# Patient Record
Sex: Female | Born: 1960
Health system: Southern US, Community
[De-identification: ages and names within clinical notes are randomized; demographics above are authoritative.]

## PROBLEM LIST (undated history)

## (undated) DIAGNOSIS — Z8489 Family history of other specified conditions: Secondary | ICD-10-CM

## (undated) DIAGNOSIS — M1990 Unspecified osteoarthritis, unspecified site: Secondary | ICD-10-CM

## (undated) DIAGNOSIS — J189 Pneumonia, unspecified organism: Secondary | ICD-10-CM

## (undated) DIAGNOSIS — Z973 Presence of spectacles and contact lenses: Secondary | ICD-10-CM

## (undated) DIAGNOSIS — I10 Essential (primary) hypertension: Secondary | ICD-10-CM

## (undated) DIAGNOSIS — H04302 Unspecified dacryocystitis of left lacrimal passage: Secondary | ICD-10-CM

## (undated) HISTORY — PX: DILATION AND CURETTAGE OF UTERUS: SHX78

## (undated) HISTORY — DX: Essential (primary) hypertension: I10

## (undated) HISTORY — PX: BUNIONECTOMY: SHX129

## (undated) HISTORY — PX: TONSILLECTOMY: SUR1361

## (undated) HISTORY — PX: FOOT SURGERY: SHX648

## (undated) HISTORY — PX: COLONOSCOPY W/ BIOPSIES AND POLYPECTOMY: SHX1376

---

## 2001-05-11 ENCOUNTER — Encounter: Admission: RE | Admit: 2001-05-11 | Discharge: 2001-05-11 | Payer: Self-pay | Admitting: Obstetrics and Gynecology

## 2001-05-11 ENCOUNTER — Encounter: Payer: Self-pay | Admitting: Obstetrics and Gynecology

## 2001-07-20 ENCOUNTER — Other Ambulatory Visit: Admission: RE | Admit: 2001-07-20 | Discharge: 2001-07-20 | Payer: Self-pay | Admitting: Obstetrics and Gynecology

## 2002-04-25 ENCOUNTER — Encounter: Payer: Self-pay | Admitting: Obstetrics and Gynecology

## 2002-04-25 ENCOUNTER — Encounter: Admission: RE | Admit: 2002-04-25 | Discharge: 2002-04-25 | Payer: Self-pay | Admitting: Obstetrics and Gynecology

## 2005-11-02 ENCOUNTER — Encounter: Admission: RE | Admit: 2005-11-02 | Discharge: 2005-11-02 | Payer: Self-pay | Admitting: Radiology

## 2006-04-12 ENCOUNTER — Encounter: Admission: RE | Admit: 2006-04-12 | Discharge: 2006-04-12 | Payer: Self-pay | Admitting: Obstetrics and Gynecology

## 2007-06-08 ENCOUNTER — Encounter: Admission: RE | Admit: 2007-06-08 | Discharge: 2007-06-08 | Payer: Self-pay | Admitting: Obstetrics and Gynecology

## 2008-09-18 ENCOUNTER — Encounter: Admission: RE | Admit: 2008-09-18 | Discharge: 2008-09-18 | Payer: Self-pay | Admitting: Obstetrics and Gynecology

## 2009-10-31 ENCOUNTER — Encounter: Admission: RE | Admit: 2009-10-31 | Discharge: 2009-10-31 | Payer: Self-pay | Admitting: Obstetrics and Gynecology

## 2011-01-02 ENCOUNTER — Ambulatory Visit
Admission: RE | Admit: 2011-01-02 | Discharge: 2011-01-02 | Disposition: A | Discharge status: Home / Self Care | Payer: PRIVATE HEALTH INSURANCE | Source: Ambulatory Visit | Attending: Family Medicine | Admitting: Family Medicine

## 2011-01-02 ENCOUNTER — Other Ambulatory Visit: Payer: Self-pay | Admitting: Family Medicine

## 2011-01-02 DIAGNOSIS — J40 Bronchitis, not specified as acute or chronic: Secondary | ICD-10-CM

## 2011-01-12 ENCOUNTER — Other Ambulatory Visit: Payer: Self-pay | Admitting: Family Medicine

## 2011-01-12 ENCOUNTER — Ambulatory Visit
Admission: RE | Admit: 2011-01-12 | Discharge: 2011-01-12 | Disposition: A | Discharge status: Home / Self Care | Payer: PRIVATE HEALTH INSURANCE | Source: Ambulatory Visit | Attending: Family Medicine | Admitting: Family Medicine

## 2011-01-12 DIAGNOSIS — J40 Bronchitis, not specified as acute or chronic: Secondary | ICD-10-CM

## 2011-02-11 ENCOUNTER — Encounter: Payer: Self-pay | Admitting: Internal Medicine

## 2011-02-12 ENCOUNTER — Institutional Professional Consult (permissible substitution): Payer: PRIVATE HEALTH INSURANCE | Admitting: Internal Medicine

## 2012-05-18 ENCOUNTER — Other Ambulatory Visit: Payer: Self-pay | Admitting: Obstetrics and Gynecology

## 2012-05-18 DIAGNOSIS — Z1231 Encounter for screening mammogram for malignant neoplasm of breast: Secondary | ICD-10-CM

## 2012-06-07 ENCOUNTER — Ambulatory Visit
Admission: RE | Admit: 2012-06-07 | Discharge: 2012-06-07 | Disposition: A | Discharge status: Home / Self Care | Payer: PRIVATE HEALTH INSURANCE | Source: Ambulatory Visit | Attending: Obstetrics and Gynecology | Admitting: Obstetrics and Gynecology

## 2012-06-07 DIAGNOSIS — Z1231 Encounter for screening mammogram for malignant neoplasm of breast: Secondary | ICD-10-CM

## 2013-08-30 ENCOUNTER — Other Ambulatory Visit: Payer: Self-pay

## 2013-08-30 DIAGNOSIS — Z1231 Encounter for screening mammogram for malignant neoplasm of breast: Secondary | ICD-10-CM

## 2013-09-07 ENCOUNTER — Ambulatory Visit
Admission: RE | Admit: 2013-09-07 | Discharge: 2013-09-07 | Disposition: A | Discharge status: Home / Self Care | Payer: PRIVATE HEALTH INSURANCE | Source: Ambulatory Visit

## 2013-09-07 DIAGNOSIS — Z1231 Encounter for screening mammogram for malignant neoplasm of breast: Secondary | ICD-10-CM

## 2014-08-28 ENCOUNTER — Other Ambulatory Visit: Payer: Self-pay

## 2014-08-28 DIAGNOSIS — Z1231 Encounter for screening mammogram for malignant neoplasm of breast: Secondary | ICD-10-CM

## 2014-09-05 ENCOUNTER — Ambulatory Visit
Admission: RE | Admit: 2014-09-05 | Discharge: 2014-09-05 | Disposition: A | Discharge status: Home / Self Care | Payer: PRIVATE HEALTH INSURANCE | Source: Ambulatory Visit

## 2014-09-05 DIAGNOSIS — Z1231 Encounter for screening mammogram for malignant neoplasm of breast: Secondary | ICD-10-CM

## 2014-09-07 ENCOUNTER — Other Ambulatory Visit: Payer: Self-pay | Admitting: Obstetrics and Gynecology

## 2014-09-07 DIAGNOSIS — R928 Other abnormal and inconclusive findings on diagnostic imaging of breast: Secondary | ICD-10-CM

## 2014-09-24 ENCOUNTER — Ambulatory Visit
Admission: RE | Admit: 2014-09-24 | Discharge: 2014-09-24 | Disposition: A | Discharge status: Home / Self Care | Payer: PRIVATE HEALTH INSURANCE | Source: Ambulatory Visit | Attending: Obstetrics and Gynecology | Admitting: Obstetrics and Gynecology

## 2014-09-24 DIAGNOSIS — R928 Other abnormal and inconclusive findings on diagnostic imaging of breast: Secondary | ICD-10-CM

## 2015-09-10 ENCOUNTER — Other Ambulatory Visit: Payer: Self-pay

## 2015-09-10 DIAGNOSIS — Z1231 Encounter for screening mammogram for malignant neoplasm of breast: Secondary | ICD-10-CM

## 2015-10-14 ENCOUNTER — Ambulatory Visit
Admission: RE | Admit: 2015-10-14 | Discharge: 2015-10-14 | Disposition: A | Discharge status: Home / Self Care | Payer: PRIVATE HEALTH INSURANCE | Source: Ambulatory Visit

## 2015-10-14 DIAGNOSIS — Z1231 Encounter for screening mammogram for malignant neoplasm of breast: Secondary | ICD-10-CM

## 2016-12-10 ENCOUNTER — Other Ambulatory Visit: Payer: Self-pay | Admitting: Obstetrics and Gynecology

## 2016-12-10 DIAGNOSIS — Z1231 Encounter for screening mammogram for malignant neoplasm of breast: Secondary | ICD-10-CM

## 2017-01-04 ENCOUNTER — Ambulatory Visit
Admission: RE | Admit: 2017-01-04 | Discharge: 2017-01-04 | Disposition: A | Discharge status: Home / Self Care | Payer: PRIVATE HEALTH INSURANCE | Source: Ambulatory Visit | Attending: Obstetrics and Gynecology | Admitting: Obstetrics and Gynecology

## 2017-01-04 DIAGNOSIS — Z1231 Encounter for screening mammogram for malignant neoplasm of breast: Secondary | ICD-10-CM

## 2018-01-24 ENCOUNTER — Other Ambulatory Visit: Payer: Self-pay | Admitting: Obstetrics and Gynecology

## 2018-01-24 DIAGNOSIS — Z1231 Encounter for screening mammogram for malignant neoplasm of breast: Secondary | ICD-10-CM

## 2018-02-10 ENCOUNTER — Ambulatory Visit
Admission: RE | Admit: 2018-02-10 | Discharge: 2018-02-10 | Disposition: A | Discharge status: Home / Self Care | Payer: PRIVATE HEALTH INSURANCE | Source: Ambulatory Visit | Attending: Obstetrics and Gynecology | Admitting: Obstetrics and Gynecology

## 2018-02-10 DIAGNOSIS — Z1231 Encounter for screening mammogram for malignant neoplasm of breast: Secondary | ICD-10-CM

## 2018-03-10 NOTE — H&P (Signed)
Subjective:    Kristi SealsJill A Silva is a 57 y.o. female who presents for evaluation of left sided dacryocystitis. The pain is described as none. Onset was several months ago. Symptoms have been unchanged since.   Review of Systems Pertinent items are noted in HPI.    Objective:   There were no vitals taken for this visit.  General:  alert, cooperative and appears stated age Skin:  normal Eyes: conjunctivae/corneas clear. PERRL, EOM's intact. Fundi benign. left sided epiphora, nasolacrimal duct obstruction Mouth: MMM no lesions Lymph Nodes:  Cervical, supraclavicular, and axillary nodes normal. Lungs:  clear to auscultation bilaterally Heart:  regular rate and rhythm, S1, S2 normal, no murmur, click, rub or gallop Abdomen: soft, non-tender; bowel sounds normal; no masses,  no organomegaly CVA:  absent Genitourinary: defer exam Extremities:  extremities normal, atraumatic, no cyanosis or edema Neurologic:  negative Psychiatric:  normal mood, behavior, speech, dress, and thought processes    Assessment: Nasolacrimal Duct Obstruction/Dacyrocystitis LEFT Side   Plan: Dacryocystorhinostomy, Anterior Ethmoidectomy, Punctoplasty, Probe, Dilation with Stent Placement LEFT Side  1. Discussed the risk of surgery,  and the risks of general anesthetic including MI, CVA, sudden death or even reaction to anesthetic medications. The patient understands the risks, any and all questions were answered to the patient's satisfaction. 2. Follow up: 1 week.  Date of Surgery Update (To be completed by Attending Surgeon day of surgery.)

## 2018-03-30 ENCOUNTER — Encounter (HOSPITAL_COMMUNITY): Payer: Self-pay | Admitting: *Deleted

## 2018-03-30 ENCOUNTER — Other Ambulatory Visit: Payer: Self-pay

## 2018-03-30 NOTE — Progress Notes (Signed)
Pt denies SOB, chest pain, and being under the care of a cardiologist. Pt denies having a stress test, echo and cardiac cath. Pt denies having an EKG and chest x ray. Pt denies recent labs. Pt made aware to stop taking vitamins, fish oil, Flaxseed oil and herbal medications. Pt made aware to stop taking  NSAIDs ie: Ibuprofen, Advil, Naproxen (Aleve), Motrin, BC and Goody Powder. Pt verbalized understanding of all pre-op instructions.

## 2018-03-31 ENCOUNTER — Ambulatory Visit (HOSPITAL_COMMUNITY)
Admission: RE | Admit: 2018-03-31 | Discharge: 2018-03-31 | Disposition: A | Discharge status: Home / Self Care | Payer: PRIVATE HEALTH INSURANCE | Source: Ambulatory Visit | Attending: Oculoplastics Ophthalmology | Admitting: Oculoplastics Ophthalmology

## 2018-03-31 ENCOUNTER — Ambulatory Visit (HOSPITAL_COMMUNITY): Payer: PRIVATE HEALTH INSURANCE | Admitting: Anesthesiology

## 2018-03-31 ENCOUNTER — Encounter (HOSPITAL_COMMUNITY)
Admission: RE | Disposition: A | Discharge status: Home / Self Care | Payer: Self-pay | Source: Ambulatory Visit | Attending: Oculoplastics Ophthalmology

## 2018-03-31 ENCOUNTER — Encounter (HOSPITAL_COMMUNITY): Payer: Self-pay | Admitting: Certified Registered Nurse Anesthetist

## 2018-03-31 DIAGNOSIS — Z79899 Other long term (current) drug therapy: Secondary | ICD-10-CM | POA: Insufficient documentation

## 2018-03-31 DIAGNOSIS — H04552 Acquired stenosis of left nasolacrimal duct: Secondary | ICD-10-CM | POA: Insufficient documentation

## 2018-03-31 DIAGNOSIS — I10 Essential (primary) hypertension: Secondary | ICD-10-CM | POA: Diagnosis not present

## 2018-03-31 DIAGNOSIS — H04413 Chronic dacryocystitis of bilateral lacrimal passages: Secondary | ICD-10-CM | POA: Diagnosis not present

## 2018-03-31 HISTORY — PX: ETHMOIDECTOMY: SHX5197

## 2018-03-31 HISTORY — DX: Presence of spectacles and contact lenses: Z97.3

## 2018-03-31 HISTORY — DX: Pneumonia, unspecified organism: J18.9

## 2018-03-31 HISTORY — PX: LACRIMAL DUCT EXPLORATION: SHX6569

## 2018-03-31 HISTORY — DX: Unspecified dacryocystitis of left lacrimal passage: H04.302

## 2018-03-31 HISTORY — PX: DACRORHINOCYSTOTOMY: SHX5559

## 2018-03-31 HISTORY — DX: Unspecified osteoarthritis, unspecified site: M19.90

## 2018-03-31 HISTORY — DX: Family history of other specified conditions: Z84.89

## 2018-03-31 LAB — CBC
HEMATOCRIT: 43.2 % (ref 36.0–46.0)
HEMOGLOBIN: 14.8 g/dL (ref 12.0–15.0)
MCH: 30.6 pg (ref 26.0–34.0)
MCHC: 34.3 g/dL (ref 30.0–36.0)
MCV: 89.3 fL (ref 78.0–100.0)
Platelets: 278 10*3/uL (ref 150–400)
RBC: 4.84 MIL/uL (ref 3.87–5.11)
RDW: 13.3 % (ref 11.5–15.5)
WBC: 6.3 10*3/uL (ref 4.0–10.5)

## 2018-03-31 LAB — BASIC METABOLIC PANEL
Anion gap: 10 (ref 5–15)
BUN: 14 mg/dL (ref 6–20)
CALCIUM: 8.9 mg/dL (ref 8.9–10.3)
CO2: 24 mmol/L (ref 22–32)
Chloride: 105 mmol/L (ref 101–111)
Creatinine, Ser: 0.66 mg/dL (ref 0.44–1.00)
GFR calc non Af Amer: >60 mL/min (ref >60)
GLUCOSE: 110 mg/dL — AB (ref 65–99)
POTASSIUM: 3.5 mmol/L (ref 3.5–5.1)
Sodium: 139 mmol/L (ref 135–145)

## 2018-03-31 LAB — PROTIME-INR
INR: 0.96
Prothrombin Time: 12.6 seconds (ref 11.4–15.2)

## 2018-03-31 SURGERY — EXPLORATION, LACRIMAL DUCT
Anesthesia: General | Site: Eye | Laterality: Left

## 2018-03-31 MED ORDER — TETRACAINE HCL 0.5 % OP SOLN
OPHTHALMIC | Status: AC
  Filled 2018-03-31: qty 4

## 2018-03-31 MED ORDER — HYDROMORPHONE HCL 2 MG/ML IJ SOLN: 0.2500 mg | INTRAMUSCULAR | Status: DC | PRN

## 2018-03-31 MED ORDER — MIDAZOLAM HCL 2 MG/2ML IJ SOLN
INTRAMUSCULAR | Status: AC
  Filled 2018-03-31: qty 2

## 2018-03-31 MED ORDER — PROMETHAZINE HCL 6.25 MG/5ML PO SYRP: 25.0000 mg | ORAL_SOLUTION | Freq: Every evening | ORAL | 1 refills | Status: AC | PRN

## 2018-03-31 MED ORDER — PHENYLEPHRINE HCL 10 MG/ML IJ SOLN
INTRAVENOUS | Status: DC | PRN
  Administered 2018-03-31: 25 ug/min via INTRAVENOUS

## 2018-03-31 MED ORDER — OXYMETAZOLINE HCL 0.05 % NA SOLN
NASAL | Status: DC | PRN
Start: 2018-03-31 — End: 2018-03-31
  Administered 2018-03-31: 1

## 2018-03-31 MED ORDER — EPINEPHRINE HCL (NASAL) 0.1 % NA SOLN
NASAL | Status: AC
  Filled 2018-03-31: qty 30

## 2018-03-31 MED ORDER — CEFAZOLIN SODIUM 1 G IJ SOLR
INTRAMUSCULAR | Status: AC
  Filled 2018-03-31: qty 20

## 2018-03-31 MED ORDER — PREDNISONE 10 MG PO TABS: 20.0000 mg | ORAL_TABLET | Freq: Every day | ORAL | 0 refills | Status: AC

## 2018-03-31 MED ORDER — ONDANSETRON HCL 4 MG/2ML IJ SOLN
INTRAMUSCULAR | Status: DC | PRN
  Administered 2018-03-31: 4 mg via INTRAVENOUS

## 2018-03-31 MED ORDER — PROPOFOL 10 MG/ML IV BOLUS
INTRAVENOUS | Status: DC | PRN
  Administered 2018-03-31: 200 mg via INTRAVENOUS

## 2018-03-31 MED ORDER — BSS IO SOLN
INTRAOCULAR | Status: AC
  Filled 2018-03-31: qty 15

## 2018-03-31 MED ORDER — EPINEPHRINE HCL (NASAL) 0.1 % NA SOLN
NASAL | Status: DC | PRN
  Administered 2018-03-31: 30 mL via TOPICAL

## 2018-03-31 MED ORDER — BUPIVACAINE HCL (PF) 0.5 % IJ SOLN
INTRAMUSCULAR | Status: AC
  Filled 2018-03-31: qty 30

## 2018-03-31 MED ORDER — HEMOSTATIC AGENTS (NO CHARGE) OPTIME
TOPICAL | Status: DC | PRN
  Administered 2018-03-31: 1 via TOPICAL

## 2018-03-31 MED ORDER — CEFAZOLIN SODIUM-DEXTROSE 2-3 GM-%(50ML) IV SOLR
INTRAVENOUS | Status: DC | PRN
  Administered 2018-03-31: 2 g via INTRAVENOUS

## 2018-03-31 MED ORDER — STERILE WATER FOR IRRIGATION IR SOLN
Status: DC | PRN
  Administered 2018-03-31: 1000 mL

## 2018-03-31 MED ORDER — FENTANYL CITRATE (PF) 100 MCG/2ML IJ SOLN
INTRAMUSCULAR | Status: DC | PRN
  Administered 2018-03-31: 100 ug via INTRAVENOUS

## 2018-03-31 MED ORDER — 0.9 % SODIUM CHLORIDE (POUR BTL) OPTIME
TOPICAL | Status: DC | PRN
  Administered 2018-03-31: 1000 mL

## 2018-03-31 MED ORDER — LIDOCAINE HCL (PF) 1 % IJ SOLN
INTRAMUSCULAR | Status: AC
  Filled 2018-03-31: qty 30

## 2018-03-31 MED ORDER — TOBRAMYCIN-DEXAMETHASONE 0.3-0.1 % OP OINT
TOPICAL_OINTMENT | OPHTHALMIC | Status: AC
  Filled 2018-03-31: qty 3.5

## 2018-03-31 MED ORDER — LIDOCAINE HCL 1 % IJ SOLN
INTRAMUSCULAR | Status: DC | PRN
  Administered 2018-03-31: 2 mL via INTRAMUSCULAR

## 2018-03-31 MED ORDER — LACTATED RINGERS IV SOLN
INTRAVENOUS | Status: DC | PRN
  Administered 2018-03-31 (×2): via INTRAVENOUS

## 2018-03-31 MED ORDER — DEXAMETHASONE SODIUM PHOSPHATE 10 MG/ML IJ SOLN
INTRAMUSCULAR | Status: DC | PRN
  Administered 2018-03-31: 10 mg via INTRAVENOUS

## 2018-03-31 MED ORDER — PHENYLEPHRINE HCL 10 MG/ML IJ SOLN
INTRAMUSCULAR | Status: DC | PRN
  Administered 2018-03-31 (×5): 80 ug via INTRAVENOUS

## 2018-03-31 MED ORDER — FENTANYL CITRATE (PF) 250 MCG/5ML IJ SOLN
INTRAMUSCULAR | Status: AC
  Filled 2018-03-31: qty 5

## 2018-03-31 MED ORDER — PROPOFOL 10 MG/ML IV BOLUS
INTRAVENOUS | Status: AC
  Filled 2018-03-31: qty 20

## 2018-03-31 MED ORDER — LIDOCAINE HCL (CARDIAC) PF 100 MG/5ML IV SOSY
PREFILLED_SYRINGE | INTRAVENOUS | Status: DC | PRN
  Administered 2018-03-31: 40 mg via INTRAVENOUS

## 2018-03-31 MED ORDER — PROMETHAZINE HCL 25 MG/ML IJ SOLN: 6.2500 mg | INTRAMUSCULAR | Status: DC | PRN

## 2018-03-31 MED ORDER — HYDROCODONE-ACETAMINOPHEN 5-325 MG PO TABS: 1.0000 | ORAL_TABLET | Freq: Four times a day (QID) | ORAL | 0 refills | Status: AC | PRN

## 2018-03-31 MED ORDER — MIDAZOLAM HCL 5 MG/5ML IJ SOLN
INTRAMUSCULAR | Status: DC | PRN
  Administered 2018-03-31: 2 mg via INTRAVENOUS

## 2018-03-31 MED ORDER — OXYMETAZOLINE HCL 0.05 % NA SOLN
NASAL | Status: AC
  Filled 2018-03-31: qty 15

## 2018-03-31 MED ORDER — TOBRAMYCIN-DEXAMETHASONE 0.3-0.1 % OP OINT
TOPICAL_OINTMENT | OPHTHALMIC | Status: DC | PRN
  Administered 2018-03-31: 1 via OPHTHALMIC

## 2018-03-31 MED ORDER — BSS IO SOLN
INTRAOCULAR | Status: DC | PRN
  Administered 2018-03-31: 15 mL

## 2018-03-31 SURGICAL SUPPLY — 42 items
APL SRG 3 HI ABS STRL LF PLS (MISCELLANEOUS) ×1
APPLICATOR DR MATTHEWS STRL (MISCELLANEOUS) ×3 IMPLANT
BLADE EYE CATARACT 19 1.4 BEAV (BLADE) ×3 IMPLANT
BLADE SURG 15 STRL LF DISP TIS (BLADE) ×1 IMPLANT
BLADE SURG 15 STRL SS (BLADE) ×3
CLEANER TIP ELECTROSURG 2X2 (MISCELLANEOUS) ×3 IMPLANT
CLOSURE STERI-STRIP 1/2X4 (GAUZE/BANDAGES/DRESSINGS) ×1
CLOSURE WOUND 1/2 X4 (GAUZE/BANDAGES/DRESSINGS) ×1
CLSR STERI-STRIP ANTIMIC 1/2X4 (GAUZE/BANDAGES/DRESSINGS) ×2 IMPLANT
COAGULATOR SUCT 6 FR SWTCH (ELECTROSURGICAL) ×1
COAGULATOR SUCT SWTCH 10FR 6 (ELECTROSURGICAL) ×2 IMPLANT
CORD BI POLAR (MISCELLANEOUS) ×3 IMPLANT
CORD BIPOLAR FORCEPS 12FT (ELECTRODE) ×3 IMPLANT
COVER SURGICAL LIGHT HANDLE (MISCELLANEOUS) ×3 IMPLANT
DRAPE ORTHO SPLIT 87X125 STRL (DRAPES) ×3 IMPLANT
FLOSEAL 5ML (HEMOSTASIS) ×2 IMPLANT
FORCEPS BIPOLAR SPETZLER 8 1.0 (NEUROSURGERY SUPPLIES) ×2 IMPLANT
GLOVE BIO SURGEON STRL SZ7.5 (GLOVE) ×6 IMPLANT
GLOVE SURG SS PI 6.0 STRL IVOR (GLOVE) ×2 IMPLANT
GLOVE SURG SS PI 6.5 STRL IVOR (GLOVE) ×2 IMPLANT
GOWN STRL REUS W/ TWL LRG LVL3 (GOWN DISPOSABLE) ×2 IMPLANT
GOWN STRL REUS W/TWL LRG LVL3 (GOWN DISPOSABLE) ×6
NDL HYPO 30X.5 LL (NEEDLE) IMPLANT
NEEDLE HYPO 30X.5 LL (NEEDLE) ×3 IMPLANT
NS IRRIG 1000ML POUR BTL (IV SOLUTION) ×3 IMPLANT
PACK CATARACT CUSTOM (CUSTOM PROCEDURE TRAY) ×3 IMPLANT
PAD ARMBOARD 7.5X6 YLW CONV (MISCELLANEOUS) ×6 IMPLANT
PATTIES SURGICAL 1/4 X 3 (GAUZE/BANDAGES/DRESSINGS) ×3 IMPLANT
PENCIL BUTTON HOLSTER BLD 10FT (ELECTRODE) ×3 IMPLANT
SET INTBT LACRIMAL .016X.025 (MISCELLANEOUS) ×2 IMPLANT
STRIP CLOSURE SKIN 1/2X4 (GAUZE/BANDAGES/DRESSINGS) ×2 IMPLANT
SUT ETHILON 6 0 9-3 1X18 BLK (SUTURE) ×1 IMPLANT
SUT ETHILON 6 0 P 1 (SUTURE) ×3 IMPLANT
SUT ETHILON 7 0 P 1 (SUTURE) IMPLANT
SUT PLAIN 5 0 P 3 18 (SUTURE) IMPLANT
SUT SILK 4 0 P 3 (SUTURE) ×5 IMPLANT
SUT SILK 6 0 BV 1XDISCX (SUTURE) IMPLANT
SUT VIC AB 5-0 RB1 27 (SUTURE) ×2 IMPLANT
TOWEL GREEN STERILE FF (TOWEL DISPOSABLE) ×2 IMPLANT
TUBE CONNECTING 12'X1/4 (SUCTIONS) ×1
TUBE CONNECTING 12X1/4 (SUCTIONS) ×2 IMPLANT
WATER STERILE IRR 1000ML POUR (IV SOLUTION) ×3 IMPLANT

## 2018-03-31 NOTE — Discharge Instructions (Addendum)
Please keep patch on for 24hrs. Do not get patch wet. Patch is OK to remove tomorrow morning.  °You will have a medicated steri-strip covering your sutures. Please keep this intact until your post-operative appointment. It is OK to shower with this strip on- avoid rubbing the area. °You will have a suture inside your nose.- Do not pick at it.  °Start Maxitrol Ophthalmic Drops 2 times a day for 2 weeks in the operated eye.  °

## 2018-03-31 NOTE — Anesthesia Procedure Notes (Signed)
Procedure Name: Intubation Date/Time: 03/31/2018 7:41 AM Performed by: Cedric Mcclaine T, CRNA Pre-anesthesia Checklist: Patient identified, Emergency Drugs available, Suction available and Patient being monitored Patient Re-evaluated:Patient Re-evaluated prior to induction Oxygen Delivery Method: Circle system utilized Preoxygenation: Pre-oxygenation with 100% oxygen Induction Type: IV induction Ventilation: Mask ventilation without difficulty Laryngoscope Size: Miller and 2 Grade View: Grade I Tube type: Oral Tube size: 7.0 mm Number of attempts: 1 Airway Equipment and Method: Patient positioned with wedge pillow and Stylet Placement Confirmation: ETT inserted through vocal cords under direct vision,  positive ETCO2 and breath sounds checked- equal and bilateral Secured at: 21 cm Tube secured with: Tape Dental Injury: Teeth and Oropharynx as per pre-operative assessment

## 2018-03-31 NOTE — Anesthesia Postprocedure Evaluation (Signed)
Anesthesia Post Note  Patient: Kristi Silva  Procedure(s) Performed: PROBE LASOLACRIMAL DUCT WITH STENT, LACRIMAL SAC BIOPSY (Left Eye) ANTERIOR ETHMOIDECTOMY (Left Eye) DACROCYSTORHINOSTOMY (DCR) (Left Eye)     Patient location during evaluation: PACU Anesthesia Type: General Level of consciousness: awake and alert Pain management: pain level controlled Vital Signs Assessment: post-procedure vital signs reviewed and stable Respiratory status: spontaneous breathing, nonlabored ventilation, respiratory function stable and patient connected to nasal cannula oxygen Cardiovascular status: blood pressure returned to baseline and stable Postop Assessment: no apparent nausea or vomiting Anesthetic complications: no Comments: Patient reported chest and epigastric discomfort. ECG ordered and reviewed. No acute abnormalities visualized or reported. Patient evaluated and reported chest pain resolved with position change.    Last Vitals:  Vitals:   03/31/18 1035 03/31/18 1055  BP:  (!) 134/99  Pulse:  72  Resp:  20  Temp: 36.6 C   SpO2:  95%    Last Pain:  Vitals:   03/31/18 1055  TempSrc:   PainSc: 0-No pain                 Ryan P Ellender

## 2018-03-31 NOTE — Transfer of Care (Signed)
Immediate Anesthesia Transfer of Care Note  Patient: Kristi Silva  Procedure(s) Performed: PROBE LASOLACRIMAL DUCT WITH STENT, LACRIMAL SAC BIOPSY (Left Eye) ANTERIOR ETHMOIDECTOMY (Left Eye) DACROCYSTORHINOSTOMY (DCR) (Left Eye)  Patient Location: PACU  Anesthesia Type:General  Level of Consciousness: awake, alert  and oriented  Airway & Oxygen Therapy: Patient Spontanous Breathing and Patient connected to nasal cannula oxygen  Post-op Assessment: Report given to RN and Post -op Vital signs reviewed and stable  Post vital signs: Reviewed and stable  Last Vitals:  Vitals Value Taken Time  BP 127/71 03/31/2018  8:51 AM  Temp    Pulse 82 03/31/2018  8:53 AM  Resp 15 03/31/2018  8:53 AM  SpO2 99 % 03/31/2018  8:53 AM  Vitals shown include unvalidated device data.  Last Pain:  Vitals:   03/31/18 0631  TempSrc: Oral  PainSc:       Patients Stated Pain Goal: 2 (03/31/18 1610)  Complications: No apparent anesthesia complications

## 2018-03-31 NOTE — Brief Op Note (Signed)
03/31/2018  7:32 AM  PATIENT:  Kristi Silva  57 y.o. female  PRE-OPERATIVE DIAGNOSIS:  ACQUIRED OBSTRUCTION OF LEFT NASOLACRIMAL DUCT  POST-OPERATIVE DIAGNOSIS:  ACQUIRED OBSTRUCTION OF LEFT NASOLACRIMAL DUCT  PROCEDURE:  Procedure(s): PROBE LASOLACRIMAL DUCT WITH STENTANTERIOR ETHMOIDECTOMY (Left) DACROCYSTORHINOSTOMY (DCR) (Left)  SURGEON:  Surgeon(s) and Role:    * Beckam Abdulaziz, Levester Fresh, MD - Primary  PHYSICIAN ASSISTANT: none  ASSISTANTS: none  ANESTHESIA:   general  EBL:  none  BLOOD ADMINISTERED:none  DRAINS: none   LOCAL MEDICATIONS USED:  MARCAINE    and LIDOCAINE   SPECIMEN:  No Specimen  DISPOSITION OF SPECIMEN:  N/A  COUNTS:  YES  TOURNIQUET:  * No tourniquets in log *  DICTATION: .Note written in EPIC  PLAN OF CARE: Discharge to home after PACU  PATIENT DISPOSITION:  PACU - hemodynamically stable.   Delay start of Pharmacological VTE agent (>24hrs) due to surgical blood loss or risk of bleeding: no

## 2018-03-31 NOTE — Anesthesia Preprocedure Evaluation (Addendum)
Anesthesia Evaluation  Patient identified by MRN, date of birth, ID band Patient awake    Reviewed: Allergy & Precautions, NPO status , Patient's Chart, lab work & pertinent test results  Airway Mallampati: I  TM Distance: >3 FB Neck ROM: Full    Dental no notable dental hx.    Pulmonary neg pulmonary ROS,    Pulmonary exam normal breath sounds clear to auscultation       Cardiovascular hypertension, Pt. on medications Normal cardiovascular exam Rhythm:Regular Rate:Normal     Neuro/Psych negative neurological ROS  negative psych ROS   GI/Hepatic negative GI ROS, Neg liver ROS,   Endo/Other  negative endocrine ROS  Renal/GU negative Renal ROS     Musculoskeletal negative musculoskeletal ROS (+)   Abdominal (+) + obese,   Peds  Hematology negative hematology ROS (+)   Anesthesia Other Findings ACQUIRED OBSTRUCTION OF LEFT NASOLACRIMAL DUCT  Reproductive/Obstetrics                            Anesthesia Physical Anesthesia Plan  ASA: II  Anesthesia Plan: General   Post-op Pain Management:    Induction: Intravenous  PONV Risk Score and Plan: 3 and Midazolam, Dexamethasone, Treatment may vary due to age or medical condition and Ondansetron  Airway Management Planned: LMA and Oral ETT  Additional Equipment:   Intra-op Plan:   Post-operative Plan: Extubation in OR  Informed Consent: I have reviewed the patients History and Physical, chart, labs and discussed the procedure including the risks, benefits and alternatives for the proposed anesthesia with the patient or authorized representative who has indicated his/her understanding and acceptance.   Dental advisory given  Plan Discussed with: CRNA  Anesthesia Plan Comments:        Anesthesia Quick Evaluation

## 2018-03-31 NOTE — Interval H&P Note (Signed)
History and Physical Interval Note:  03/31/2018 7:26 AM  Kristi Silva  has presented today for surgery, with the diagnosis of ACQUIRED OBSTRUCTION OF LEFT NASOLACRIMAL DUCT  The various methods of treatment have been discussed with the patient and family. After consideration of risks, benefits and other options for treatment, the patient has consented to  Procedure(s): PROBE LASOLACRIMAL DUCT WITH STENT, LACRIMAL SAC BIOPSY (Left) ANTERIOR ETHMOIDECTOMY (Left) DACROCYSTORHINOSTOMY (DCR) (Left) as a surgical intervention .  The patient's history has been reviewed, patient examined, no change in status, stable for surgery.  I have reviewed the patient's chart and labs.  Questions were answered to the patient's satisfaction.     [name]

## 2018-03-31 NOTE — Op Note (Signed)
Procedure(s): PROBE LASOLACRIMAL DUCT WITH STENT, LACRIMAL SAC BIOPSY ANTERIOR ETHMOIDECTOMY DACROCYSTORHINOSTOMY (DCR) Procedure Note  Kristi Silva female 57 y.o. 03/31/2018  Procedure(s) and Anesthesia Type:    * PROBE LASOLACRIMAL DUCT WITH STENT, LACRIMAL SAC BIOPSY - General    * ANTERIOR ETHMOIDECTOMY - General    * DACROCYSTORHINOSTOMY (DCR) - General  Surgeon(s) and Role:    * [name], MD - Primary  Procedure Detail  PROBE LASOLACRIMAL DUCT WITH STENT, LACRIMAL SAC BIOPSY, ANTERIOR ETHMOIDECTOMY, DACROCYSTORHINOSTOMY (DCR)   Indications: The patient was admitted to the hospital with a brief history of left-sided nasolacrimal obstruction.  An intitial probing revealed chronic dacryocystitis. The patient now presents for dacryocystorhinostomy after discussing therapeutic alternatives.        Surgeon: [name]   Assistants: none Anesthesia: General endotracheal anesthesia and Local anesthesia 1% buffered lidocaine, with epinephrine, .75 bupivicaine  ASA Class: 3  Procedure Detail  Patient was brought to the room in supine position where the appropriate monitoring was put in place. Then the patient was prepped and draped in the usual standard fashion of plastic surgery. The right nasal cavity was packed with cottonoid's soaked in afrin.  Attention was turned to the area between the medial canthus and the nasal bridge which has been previously marked. A 15 blade was used to incise the area. Then blunt dissection was taken down to the level of the anterior lacrimal crest. Then the periosteal elevator was used to bluntly remove the periosteum and the lacrimal sac off of the lacrimal crest. The elevator was used to infracture below the lacrimal crest to create an opening into the nasal cavity. Then a kerrison rongeur was used to remove the bone approximately 15mm to create a window into the nasal cavity. The nasal mucosa was infiltrated with local block.  Then an anterior ethmoidectomy took place. Antention was turned to the puncta where a 1 bowman probe was used to ensure the appropriate position of the nasolacrimal intubation tubes. A beaver blade was used to create an opening into the nasal cavity through the nasal mucosa. Crawford tubes were then inserted through the upper and lower canalicular system and retrieved through the nasal cavity with a curved forcep. This was sutured to the lateral nasal mucosa with a 4-0 silk suture.  Hemostasis was maintained with monopolar and bipolar cautery.  Surgiflow was placed into the defect and then the incision was closed with 5-0 undyed vicryl and then the skin was closed in an interrupted fashion with 6-0 nylon. Tobradex ointment was placed over the wound and it was covered with a steri-strip. Then the right nose was covered with an eyepad.  The patient tolerated the procedure well and was transferred to the PACU In stable condition.   Findings: None   Estimated Blood Loss:  less than 50 mL         Drains: none         Total IV Fluids: 1.5L  Blood Given: none          Specimens: none         Implants: Crawford Stent         Complications:  * No complications entered in OR log *         Disposition: PACU - hemodynamically stable.         Condition: stable

## 2018-04-01 ENCOUNTER — Encounter (HOSPITAL_COMMUNITY): Payer: Self-pay | Admitting: Oculoplastics Ophthalmology

## 2019-04-25 DIAGNOSIS — K080 Exfoliation of teeth due to systemic causes: Secondary | ICD-10-CM | POA: Diagnosis not present

## 2019-04-26 DIAGNOSIS — E785 Hyperlipidemia, unspecified: Secondary | ICD-10-CM | POA: Diagnosis not present

## 2019-07-25 DIAGNOSIS — E785 Hyperlipidemia, unspecified: Secondary | ICD-10-CM | POA: Diagnosis not present

## 2019-07-25 DIAGNOSIS — M170 Bilateral primary osteoarthritis of knee: Secondary | ICD-10-CM | POA: Diagnosis not present

## 2019-07-25 DIAGNOSIS — I10 Essential (primary) hypertension: Secondary | ICD-10-CM | POA: Diagnosis not present

## 2019-10-26 ENCOUNTER — Other Ambulatory Visit: Payer: Self-pay | Admitting: Family Medicine

## 2019-10-26 DIAGNOSIS — Z1231 Encounter for screening mammogram for malignant neoplasm of breast: Secondary | ICD-10-CM

## 2019-11-04 ENCOUNTER — Ambulatory Visit
Admission: RE | Admit: 2019-11-04 | Discharge: 2019-11-04 | Disposition: A | Discharge status: Home / Self Care | Payer: PRIVATE HEALTH INSURANCE | Source: Ambulatory Visit | Attending: Family Medicine | Admitting: Family Medicine

## 2019-11-04 ENCOUNTER — Other Ambulatory Visit: Payer: Self-pay

## 2019-11-04 DIAGNOSIS — Z1231 Encounter for screening mammogram for malignant neoplasm of breast: Secondary | ICD-10-CM | POA: Diagnosis not present

## 2020-01-15 DIAGNOSIS — Z23 Encounter for immunization: Secondary | ICD-10-CM | POA: Diagnosis not present

## 2020-01-22 DIAGNOSIS — E785 Hyperlipidemia, unspecified: Secondary | ICD-10-CM | POA: Diagnosis not present

## 2020-01-22 DIAGNOSIS — I10 Essential (primary) hypertension: Secondary | ICD-10-CM | POA: Diagnosis not present

## 2020-01-22 DIAGNOSIS — M170 Bilateral primary osteoarthritis of knee: Secondary | ICD-10-CM | POA: Diagnosis not present

## 2020-02-05 DIAGNOSIS — Z23 Encounter for immunization: Secondary | ICD-10-CM | POA: Diagnosis not present

## 2020-02-26 DIAGNOSIS — I10 Essential (primary) hypertension: Secondary | ICD-10-CM | POA: Diagnosis not present

## 2020-02-26 DIAGNOSIS — E785 Hyperlipidemia, unspecified: Secondary | ICD-10-CM | POA: Diagnosis not present

## 2020-02-26 DIAGNOSIS — R7301 Impaired fasting glucose: Secondary | ICD-10-CM | POA: Diagnosis not present

## 2020-06-07 DIAGNOSIS — E785 Hyperlipidemia, unspecified: Secondary | ICD-10-CM | POA: Diagnosis not present

## 2020-08-21 DIAGNOSIS — E785 Hyperlipidemia, unspecified: Secondary | ICD-10-CM | POA: Diagnosis not present

## 2020-08-21 DIAGNOSIS — I10 Essential (primary) hypertension: Secondary | ICD-10-CM | POA: Diagnosis not present

## 2020-08-21 DIAGNOSIS — E669 Obesity, unspecified: Secondary | ICD-10-CM | POA: Diagnosis not present

## 2020-08-21 DIAGNOSIS — M170 Bilateral primary osteoarthritis of knee: Secondary | ICD-10-CM | POA: Diagnosis not present

## 2020-08-26 DIAGNOSIS — Z23 Encounter for immunization: Secondary | ICD-10-CM | POA: Diagnosis not present

## 2021-01-15 ENCOUNTER — Other Ambulatory Visit: Payer: Self-pay | Admitting: Family Medicine

## 2021-01-15 DIAGNOSIS — Z1231 Encounter for screening mammogram for malignant neoplasm of breast: Secondary | ICD-10-CM

## 2021-02-19 ENCOUNTER — Other Ambulatory Visit (HOSPITAL_COMMUNITY)
Admission: RE | Admit: 2021-02-19 | Discharge: 2021-02-19 | Disposition: A | Discharge status: Home / Self Care | Payer: Federal, State, Local not specified - PPO | Source: Ambulatory Visit | Attending: Physician Assistant | Admitting: Physician Assistant

## 2021-02-19 ENCOUNTER — Other Ambulatory Visit: Payer: Self-pay

## 2021-02-19 ENCOUNTER — Ambulatory Visit
Admission: RE | Admit: 2021-02-19 | Discharge: 2021-02-19 | Disposition: A | Discharge status: Home / Self Care | Payer: Federal, State, Local not specified - PPO | Source: Ambulatory Visit

## 2021-02-19 DIAGNOSIS — Z0000 Encounter for general adult medical examination without abnormal findings: Secondary | ICD-10-CM | POA: Diagnosis not present

## 2021-02-19 DIAGNOSIS — I10 Essential (primary) hypertension: Secondary | ICD-10-CM | POA: Diagnosis not present

## 2021-02-19 DIAGNOSIS — Z1231 Encounter for screening mammogram for malignant neoplasm of breast: Secondary | ICD-10-CM | POA: Diagnosis not present

## 2021-02-19 DIAGNOSIS — E785 Hyperlipidemia, unspecified: Secondary | ICD-10-CM | POA: Diagnosis not present

## 2021-02-19 DIAGNOSIS — M170 Bilateral primary osteoarthritis of knee: Secondary | ICD-10-CM | POA: Diagnosis not present

## 2021-02-21 LAB — CYTOLOGY - PAP
Comment: NEGATIVE
Diagnosis: NEGATIVE
High risk HPV: NEGATIVE

## 2021-06-04 DIAGNOSIS — E785 Hyperlipidemia, unspecified: Secondary | ICD-10-CM | POA: Diagnosis not present

## 2022-01-22 ENCOUNTER — Other Ambulatory Visit: Payer: Self-pay | Admitting: Physician Assistant

## 2022-01-22 DIAGNOSIS — Z1231 Encounter for screening mammogram for malignant neoplasm of breast: Secondary | ICD-10-CM

## 2022-02-25 ENCOUNTER — Ambulatory Visit
Admission: RE | Admit: 2022-02-25 | Discharge: 2022-02-25 | Disposition: A | Discharge status: Home / Self Care | Payer: Federal, State, Local not specified - PPO | Source: Ambulatory Visit | Attending: Physician Assistant | Admitting: Physician Assistant

## 2022-02-25 DIAGNOSIS — Z0000 Encounter for general adult medical examination without abnormal findings: Secondary | ICD-10-CM | POA: Diagnosis not present

## 2022-02-25 DIAGNOSIS — M170 Bilateral primary osteoarthritis of knee: Secondary | ICD-10-CM | POA: Diagnosis not present

## 2022-02-25 DIAGNOSIS — Z1231 Encounter for screening mammogram for malignant neoplasm of breast: Secondary | ICD-10-CM | POA: Diagnosis not present

## 2022-02-25 DIAGNOSIS — I10 Essential (primary) hypertension: Secondary | ICD-10-CM | POA: Diagnosis not present

## 2022-02-25 DIAGNOSIS — E785 Hyperlipidemia, unspecified: Secondary | ICD-10-CM | POA: Diagnosis not present

## 2022-11-11 DIAGNOSIS — L918 Other hypertrophic disorders of the skin: Secondary | ICD-10-CM | POA: Diagnosis not present

## 2022-11-11 DIAGNOSIS — K1379 Other lesions of oral mucosa: Secondary | ICD-10-CM | POA: Diagnosis not present

## 2022-11-11 DIAGNOSIS — L304 Erythema intertrigo: Secondary | ICD-10-CM | POA: Diagnosis not present

## 2022-11-11 DIAGNOSIS — L814 Other melanin hyperpigmentation: Secondary | ICD-10-CM | POA: Diagnosis not present

## 2022-11-26 ENCOUNTER — Other Ambulatory Visit: Payer: Self-pay | Admitting: Family Medicine

## 2022-11-26 DIAGNOSIS — Z1231 Encounter for screening mammogram for malignant neoplasm of breast: Secondary | ICD-10-CM

## 2022-12-17 DIAGNOSIS — D230 Other benign neoplasm of skin of lip: Secondary | ICD-10-CM | POA: Diagnosis not present

## 2022-12-17 DIAGNOSIS — L304 Erythema intertrigo: Secondary | ICD-10-CM | POA: Diagnosis not present

## 2022-12-17 DIAGNOSIS — D3701 Neoplasm of uncertain behavior of lip: Secondary | ICD-10-CM | POA: Diagnosis not present

## 2023-03-01 ENCOUNTER — Ambulatory Visit
Admission: RE | Admit: 2023-03-01 | Discharge: 2023-03-01 | Disposition: A | Discharge status: Home / Self Care | Payer: Federal, State, Local not specified - PPO | Source: Ambulatory Visit | Attending: Family Medicine | Admitting: Family Medicine

## 2023-03-01 DIAGNOSIS — Z0000 Encounter for general adult medical examination without abnormal findings: Secondary | ICD-10-CM | POA: Diagnosis not present

## 2023-03-01 DIAGNOSIS — I10 Essential (primary) hypertension: Secondary | ICD-10-CM | POA: Diagnosis not present

## 2023-03-01 DIAGNOSIS — K219 Gastro-esophageal reflux disease without esophagitis: Secondary | ICD-10-CM | POA: Diagnosis not present

## 2023-03-01 DIAGNOSIS — Z1231 Encounter for screening mammogram for malignant neoplasm of breast: Secondary | ICD-10-CM

## 2023-03-01 DIAGNOSIS — E669 Obesity, unspecified: Secondary | ICD-10-CM | POA: Diagnosis not present

## 2023-03-01 DIAGNOSIS — Z13220 Encounter for screening for lipoid disorders: Secondary | ICD-10-CM | POA: Diagnosis not present

## 2023-03-01 DIAGNOSIS — M170 Bilateral primary osteoarthritis of knee: Secondary | ICD-10-CM | POA: Diagnosis not present

## 2023-05-11 DIAGNOSIS — H35713 Central serous chorioretinopathy, bilateral: Secondary | ICD-10-CM | POA: Diagnosis not present

## 2023-05-11 DIAGNOSIS — H2513 Age-related nuclear cataract, bilateral: Secondary | ICD-10-CM | POA: Diagnosis not present

## 2023-05-11 DIAGNOSIS — H4321 Crystalline deposits in vitreous body, right eye: Secondary | ICD-10-CM | POA: Diagnosis not present

## 2023-06-07 DIAGNOSIS — M25562 Pain in left knee: Secondary | ICD-10-CM | POA: Diagnosis not present

## 2023-06-07 DIAGNOSIS — M25561 Pain in right knee: Secondary | ICD-10-CM | POA: Diagnosis not present

## 2023-07-30 DIAGNOSIS — Z09 Encounter for follow-up examination after completed treatment for conditions other than malignant neoplasm: Secondary | ICD-10-CM | POA: Diagnosis not present

## 2023-07-30 DIAGNOSIS — K5730 Diverticulosis of large intestine without perforation or abscess without bleeding: Secondary | ICD-10-CM | POA: Diagnosis not present

## 2023-07-30 DIAGNOSIS — D123 Benign neoplasm of transverse colon: Secondary | ICD-10-CM | POA: Diagnosis not present

## 2023-07-30 DIAGNOSIS — D122 Benign neoplasm of ascending colon: Secondary | ICD-10-CM | POA: Diagnosis not present

## 2023-07-30 DIAGNOSIS — Z86010 Personal history of colonic polyps: Secondary | ICD-10-CM | POA: Diagnosis not present

## 2023-07-30 DIAGNOSIS — Z800 Family history of malignant neoplasm of digestive organs: Secondary | ICD-10-CM | POA: Diagnosis not present

## 2023-08-23 DIAGNOSIS — M21962 Unspecified acquired deformity of left lower leg: Secondary | ICD-10-CM | POA: Diagnosis not present

## 2023-08-26 DIAGNOSIS — Z0189 Encounter for other specified special examinations: Secondary | ICD-10-CM | POA: Diagnosis not present

## 2023-08-27 DIAGNOSIS — M1712 Unilateral primary osteoarthritis, left knee: Secondary | ICD-10-CM | POA: Diagnosis not present

## 2023-09-07 DIAGNOSIS — M1712 Unilateral primary osteoarthritis, left knee: Secondary | ICD-10-CM | POA: Diagnosis not present

## 2023-09-07 DIAGNOSIS — G8918 Other acute postprocedural pain: Secondary | ICD-10-CM | POA: Diagnosis not present

## 2023-09-10 DIAGNOSIS — Z96659 Presence of unspecified artificial knee joint: Secondary | ICD-10-CM | POA: Diagnosis not present

## 2023-09-10 DIAGNOSIS — Z4889 Encounter for other specified surgical aftercare: Secondary | ICD-10-CM | POA: Diagnosis not present

## 2023-09-10 DIAGNOSIS — R262 Difficulty in walking, not elsewhere classified: Secondary | ICD-10-CM | POA: Diagnosis not present

## 2023-09-10 DIAGNOSIS — R29898 Other symptoms and signs involving the musculoskeletal system: Secondary | ICD-10-CM | POA: Diagnosis not present

## 2023-09-13 DIAGNOSIS — Z96659 Presence of unspecified artificial knee joint: Secondary | ICD-10-CM | POA: Diagnosis not present

## 2023-09-13 DIAGNOSIS — R29898 Other symptoms and signs involving the musculoskeletal system: Secondary | ICD-10-CM | POA: Diagnosis not present

## 2023-09-13 DIAGNOSIS — Z4889 Encounter for other specified surgical aftercare: Secondary | ICD-10-CM | POA: Diagnosis not present

## 2023-09-13 DIAGNOSIS — R262 Difficulty in walking, not elsewhere classified: Secondary | ICD-10-CM | POA: Diagnosis not present

## 2023-09-15 DIAGNOSIS — R262 Difficulty in walking, not elsewhere classified: Secondary | ICD-10-CM | POA: Diagnosis not present

## 2023-09-15 DIAGNOSIS — Z4889 Encounter for other specified surgical aftercare: Secondary | ICD-10-CM | POA: Diagnosis not present

## 2023-09-15 DIAGNOSIS — Z96659 Presence of unspecified artificial knee joint: Secondary | ICD-10-CM | POA: Diagnosis not present

## 2023-09-15 DIAGNOSIS — R29898 Other symptoms and signs involving the musculoskeletal system: Secondary | ICD-10-CM | POA: Diagnosis not present

## 2023-09-20 DIAGNOSIS — R262 Difficulty in walking, not elsewhere classified: Secondary | ICD-10-CM | POA: Diagnosis not present

## 2023-09-20 DIAGNOSIS — Z4889 Encounter for other specified surgical aftercare: Secondary | ICD-10-CM | POA: Diagnosis not present

## 2023-09-20 DIAGNOSIS — R29898 Other symptoms and signs involving the musculoskeletal system: Secondary | ICD-10-CM | POA: Diagnosis not present

## 2023-09-20 DIAGNOSIS — Z96659 Presence of unspecified artificial knee joint: Secondary | ICD-10-CM | POA: Diagnosis not present

## 2023-09-22 DIAGNOSIS — Z4889 Encounter for other specified surgical aftercare: Secondary | ICD-10-CM | POA: Diagnosis not present

## 2023-09-22 DIAGNOSIS — Z96659 Presence of unspecified artificial knee joint: Secondary | ICD-10-CM | POA: Diagnosis not present

## 2023-09-22 DIAGNOSIS — R29898 Other symptoms and signs involving the musculoskeletal system: Secondary | ICD-10-CM | POA: Diagnosis not present

## 2023-09-22 DIAGNOSIS — R262 Difficulty in walking, not elsewhere classified: Secondary | ICD-10-CM | POA: Diagnosis not present

## 2023-09-27 DIAGNOSIS — R262 Difficulty in walking, not elsewhere classified: Secondary | ICD-10-CM | POA: Diagnosis not present

## 2023-09-27 DIAGNOSIS — Z4889 Encounter for other specified surgical aftercare: Secondary | ICD-10-CM | POA: Diagnosis not present

## 2023-09-27 DIAGNOSIS — R29898 Other symptoms and signs involving the musculoskeletal system: Secondary | ICD-10-CM | POA: Diagnosis not present

## 2023-09-27 DIAGNOSIS — Z96659 Presence of unspecified artificial knee joint: Secondary | ICD-10-CM | POA: Diagnosis not present

## 2023-09-29 DIAGNOSIS — R262 Difficulty in walking, not elsewhere classified: Secondary | ICD-10-CM | POA: Diagnosis not present

## 2023-09-29 DIAGNOSIS — Z96659 Presence of unspecified artificial knee joint: Secondary | ICD-10-CM | POA: Diagnosis not present

## 2023-09-29 DIAGNOSIS — R29898 Other symptoms and signs involving the musculoskeletal system: Secondary | ICD-10-CM | POA: Diagnosis not present

## 2023-09-29 DIAGNOSIS — Z4889 Encounter for other specified surgical aftercare: Secondary | ICD-10-CM | POA: Diagnosis not present

## 2023-10-08 DIAGNOSIS — Z96652 Presence of left artificial knee joint: Secondary | ICD-10-CM | POA: Diagnosis not present

## 2023-10-11 DIAGNOSIS — Z96659 Presence of unspecified artificial knee joint: Secondary | ICD-10-CM | POA: Diagnosis not present

## 2023-10-11 DIAGNOSIS — R29898 Other symptoms and signs involving the musculoskeletal system: Secondary | ICD-10-CM | POA: Diagnosis not present

## 2023-10-11 DIAGNOSIS — R262 Difficulty in walking, not elsewhere classified: Secondary | ICD-10-CM | POA: Diagnosis not present

## 2023-10-11 DIAGNOSIS — Z4889 Encounter for other specified surgical aftercare: Secondary | ICD-10-CM | POA: Diagnosis not present

## 2023-10-13 DIAGNOSIS — Z4889 Encounter for other specified surgical aftercare: Secondary | ICD-10-CM | POA: Diagnosis not present

## 2023-10-13 DIAGNOSIS — R262 Difficulty in walking, not elsewhere classified: Secondary | ICD-10-CM | POA: Diagnosis not present

## 2023-10-13 DIAGNOSIS — Z96659 Presence of unspecified artificial knee joint: Secondary | ICD-10-CM | POA: Diagnosis not present

## 2023-10-13 DIAGNOSIS — R29898 Other symptoms and signs involving the musculoskeletal system: Secondary | ICD-10-CM | POA: Diagnosis not present

## 2023-10-19 DIAGNOSIS — Z96659 Presence of unspecified artificial knee joint: Secondary | ICD-10-CM | POA: Diagnosis not present

## 2023-10-19 DIAGNOSIS — R262 Difficulty in walking, not elsewhere classified: Secondary | ICD-10-CM | POA: Diagnosis not present

## 2023-10-19 DIAGNOSIS — Z4889 Encounter for other specified surgical aftercare: Secondary | ICD-10-CM | POA: Diagnosis not present

## 2023-10-19 DIAGNOSIS — R29898 Other symptoms and signs involving the musculoskeletal system: Secondary | ICD-10-CM | POA: Diagnosis not present

## 2023-10-27 DIAGNOSIS — R29898 Other symptoms and signs involving the musculoskeletal system: Secondary | ICD-10-CM | POA: Diagnosis not present

## 2023-10-27 DIAGNOSIS — Z96659 Presence of unspecified artificial knee joint: Secondary | ICD-10-CM | POA: Diagnosis not present

## 2023-10-27 DIAGNOSIS — Z4889 Encounter for other specified surgical aftercare: Secondary | ICD-10-CM | POA: Diagnosis not present

## 2023-10-27 DIAGNOSIS — R262 Difficulty in walking, not elsewhere classified: Secondary | ICD-10-CM | POA: Diagnosis not present

## 2023-10-28 IMAGING — MG MM DIGITAL SCREENING BILAT W/ TOMO AND CAD
8 series · 8 of 24 positions shown · non-contrast
Comparison: Previous exam(s).

CLINICAL DATA: Screening.

EXAM:
DIGITAL SCREENING BILATERAL MAMMOGRAM WITH TOMOSYNTHESIS AND CAD
TECHNIQUE: Bilateral screening digital craniocaudal and mediolateral oblique
mammograms were obtained. Bilateral screening digital breast
tomosynthesis was performed. The images were evaluated with
computer-aided detection.

[R CC synth-2D]
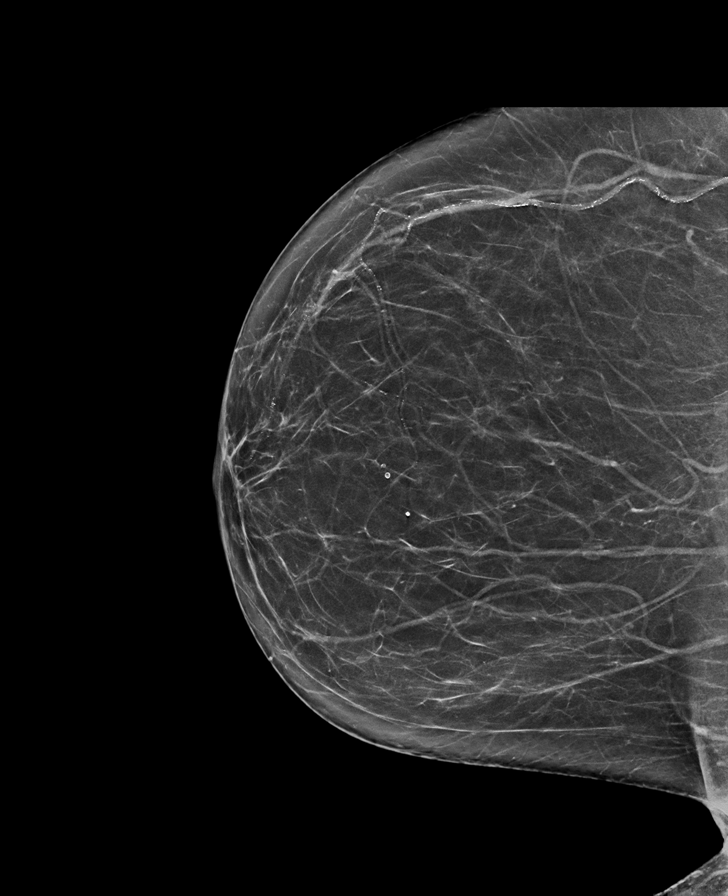

[R MLO synth-2D]
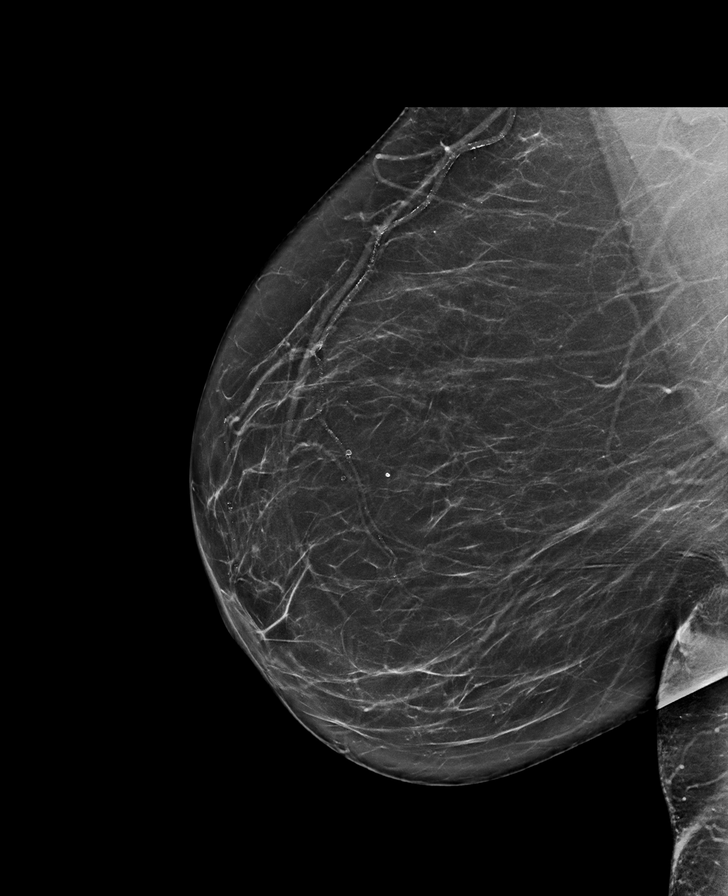

[L MLO synth-2D]
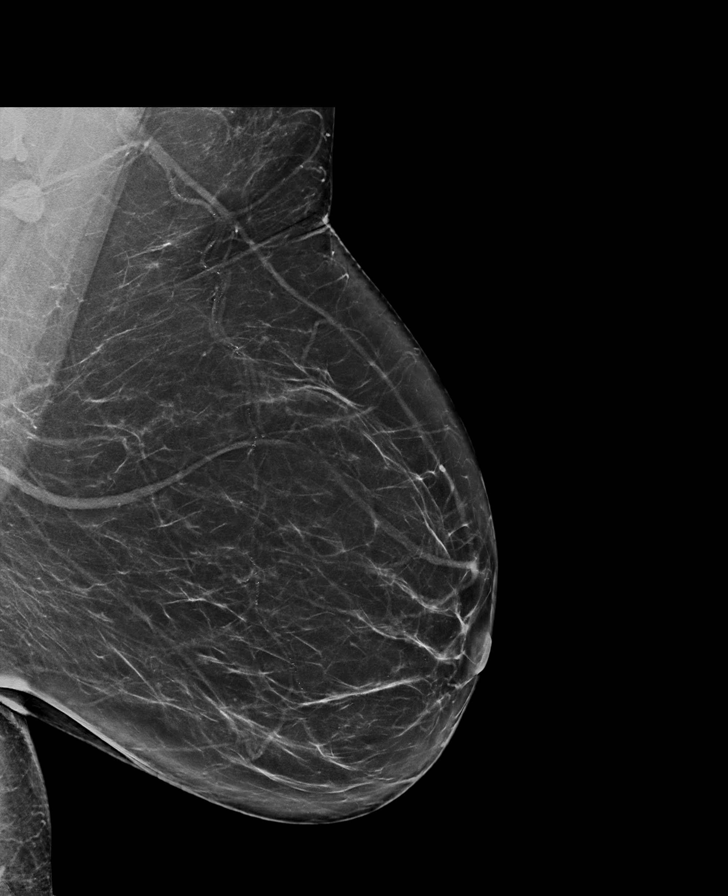

[L CC synth-2D]
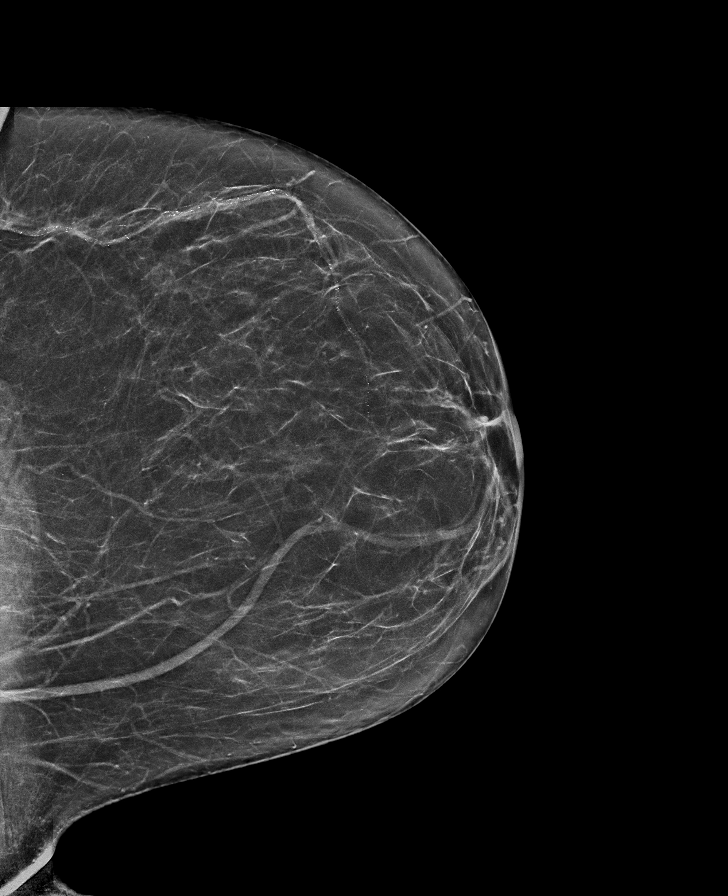

[L CC tomo · tomo slice 35/68.0]
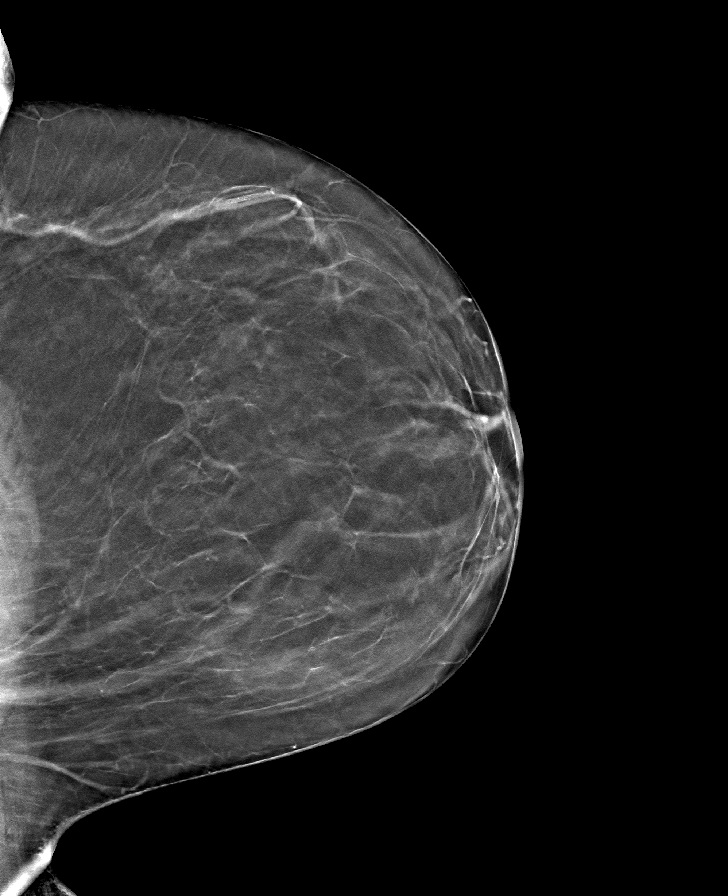

[L MLO tomo · tomo slice 41/80.0]
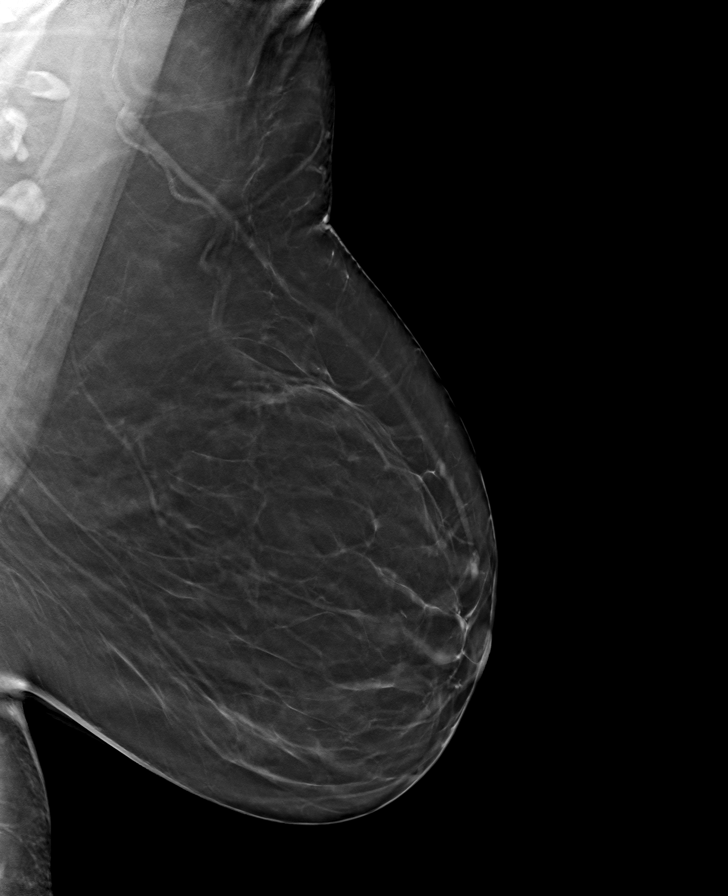

[R MLO tomo · tomo slice 40/79.0]
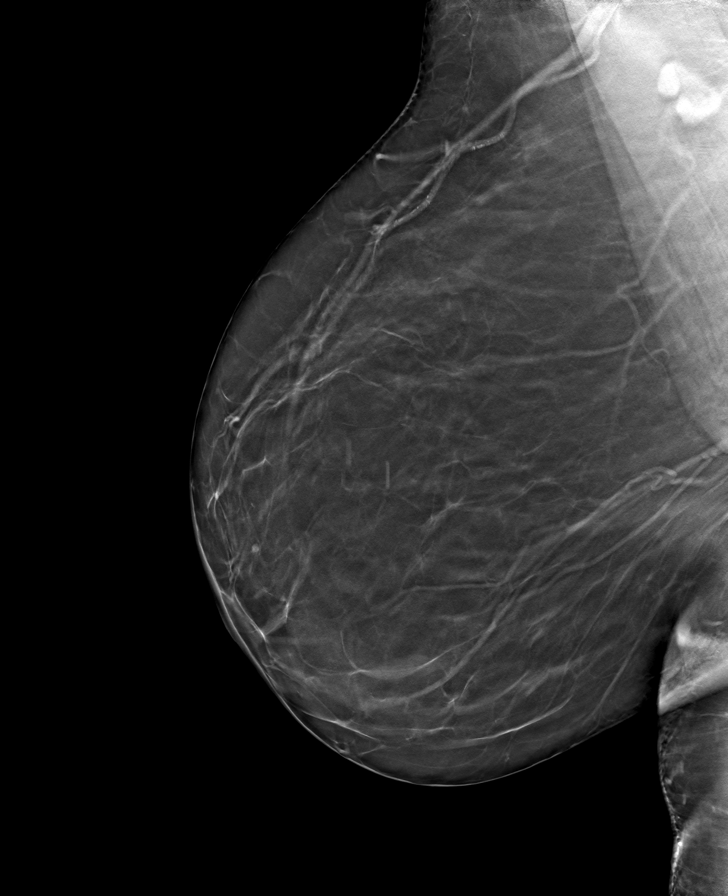

[R CC tomo · tomo slice 35/70.0]
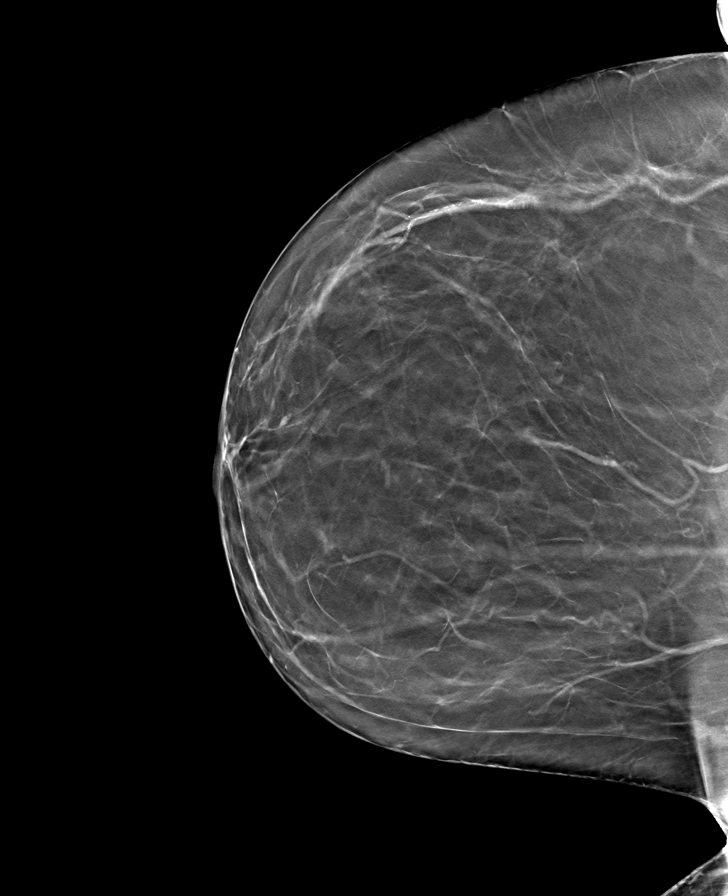

[8 of 24 positions shown; findings below may reference images not displayed]

ACR Breast Density Category b: There are scattered areas of
fibroglandular density.
FINDINGS: There are no findings suspicious for malignancy.
IMPRESSION: No mammographic evidence of malignancy. A result letter of this
screening mammogram will be mailed directly to the patient.

RECOMMENDATION:
Screening mammogram in one year. (Code:51-O-LD2)

BI-RADS CATEGORY  1: Negative.

## 2023-11-03 DIAGNOSIS — R29898 Other symptoms and signs involving the musculoskeletal system: Secondary | ICD-10-CM | POA: Diagnosis not present

## 2023-11-03 DIAGNOSIS — Z4889 Encounter for other specified surgical aftercare: Secondary | ICD-10-CM | POA: Diagnosis not present

## 2023-11-03 DIAGNOSIS — Z96659 Presence of unspecified artificial knee joint: Secondary | ICD-10-CM | POA: Diagnosis not present

## 2023-11-03 DIAGNOSIS — R262 Difficulty in walking, not elsewhere classified: Secondary | ICD-10-CM | POA: Diagnosis not present

## 2023-11-10 DIAGNOSIS — R29898 Other symptoms and signs involving the musculoskeletal system: Secondary | ICD-10-CM | POA: Diagnosis not present

## 2023-11-10 DIAGNOSIS — Z4889 Encounter for other specified surgical aftercare: Secondary | ICD-10-CM | POA: Diagnosis not present

## 2023-11-10 DIAGNOSIS — R262 Difficulty in walking, not elsewhere classified: Secondary | ICD-10-CM | POA: Diagnosis not present

## 2023-11-10 DIAGNOSIS — Z96659 Presence of unspecified artificial knee joint: Secondary | ICD-10-CM | POA: Diagnosis not present

## 2023-11-11 DIAGNOSIS — L239 Allergic contact dermatitis, unspecified cause: Secondary | ICD-10-CM | POA: Diagnosis not present

## 2023-11-11 DIAGNOSIS — B001 Herpesviral vesicular dermatitis: Secondary | ICD-10-CM | POA: Diagnosis not present

## 2023-12-01 DIAGNOSIS — L821 Other seborrheic keratosis: Secondary | ICD-10-CM | POA: Diagnosis not present

## 2023-12-01 DIAGNOSIS — L239 Allergic contact dermatitis, unspecified cause: Secondary | ICD-10-CM | POA: Diagnosis not present

## 2023-12-01 DIAGNOSIS — D225 Melanocytic nevi of trunk: Secondary | ICD-10-CM | POA: Diagnosis not present

## 2023-12-01 DIAGNOSIS — L304 Erythema intertrigo: Secondary | ICD-10-CM | POA: Diagnosis not present

## 2023-12-22 DIAGNOSIS — Z96652 Presence of left artificial knee joint: Secondary | ICD-10-CM | POA: Diagnosis not present

## 2023-12-27 ENCOUNTER — Other Ambulatory Visit: Payer: Self-pay | Admitting: Family Medicine

## 2023-12-27 DIAGNOSIS — Z0000 Encounter for general adult medical examination without abnormal findings: Secondary | ICD-10-CM

## 2024-03-02 ENCOUNTER — Ambulatory Visit
Admission: RE | Admit: 2024-03-02 | Discharge: 2024-03-02 | Disposition: A | Discharge status: Home / Self Care | Payer: Federal, State, Local not specified - PPO | Source: Ambulatory Visit | Attending: Family Medicine | Admitting: Family Medicine

## 2024-03-02 DIAGNOSIS — Z1231 Encounter for screening mammogram for malignant neoplasm of breast: Secondary | ICD-10-CM | POA: Diagnosis not present

## 2024-03-02 DIAGNOSIS — E782 Mixed hyperlipidemia: Secondary | ICD-10-CM | POA: Diagnosis not present

## 2024-03-02 DIAGNOSIS — Z23 Encounter for immunization: Secondary | ICD-10-CM | POA: Diagnosis not present

## 2024-03-02 DIAGNOSIS — M170 Bilateral primary osteoarthritis of knee: Secondary | ICD-10-CM | POA: Diagnosis not present

## 2024-03-02 DIAGNOSIS — I10 Essential (primary) hypertension: Secondary | ICD-10-CM | POA: Diagnosis not present

## 2024-03-02 DIAGNOSIS — K219 Gastro-esophageal reflux disease without esophagitis: Secondary | ICD-10-CM | POA: Diagnosis not present

## 2024-03-02 DIAGNOSIS — Z0000 Encounter for general adult medical examination without abnormal findings: Secondary | ICD-10-CM

## 2024-08-30 ENCOUNTER — Other Ambulatory Visit: Payer: Self-pay | Admitting: Family Medicine

## 2024-08-30 DIAGNOSIS — Z1231 Encounter for screening mammogram for malignant neoplasm of breast: Secondary | ICD-10-CM

## 2025-03-08 ENCOUNTER — Ambulatory Visit
